# Patient Record
Sex: Male | Born: 1985 | Race: White | Hispanic: No | Marital: Single | Smoking: Never smoker
Health system: Southern US, Community
[De-identification: ages and names within clinical notes are randomized; demographics above are authoritative.]

---

## 2017-01-06 ENCOUNTER — Emergency Department (HOSPITAL_COMMUNITY)
Admission: EM | Admit: 2017-01-06 | Discharge: 2017-01-06 | Disposition: A | Discharge status: Home / Self Care | Payer: Self-pay | Attending: Emergency Medicine | Admitting: Emergency Medicine

## 2017-01-06 ENCOUNTER — Emergency Department (HOSPITAL_COMMUNITY): Payer: Self-pay

## 2017-01-06 ENCOUNTER — Encounter (HOSPITAL_COMMUNITY): Payer: Self-pay

## 2017-01-06 DIAGNOSIS — S01311A Laceration without foreign body of right ear, initial encounter: Secondary | ICD-10-CM | POA: Insufficient documentation

## 2017-01-06 DIAGNOSIS — Y939 Activity, unspecified: Secondary | ICD-10-CM | POA: Insufficient documentation

## 2017-01-06 DIAGNOSIS — Y9259 Other trade areas as the place of occurrence of the external cause: Secondary | ICD-10-CM | POA: Insufficient documentation

## 2017-01-06 DIAGNOSIS — S025XXB Fracture of tooth (traumatic), initial encounter for open fracture: Secondary | ICD-10-CM | POA: Insufficient documentation

## 2017-01-06 DIAGNOSIS — Z23 Encounter for immunization: Secondary | ICD-10-CM | POA: Insufficient documentation

## 2017-01-06 DIAGNOSIS — Y999 Unspecified external cause status: Secondary | ICD-10-CM | POA: Insufficient documentation

## 2017-01-06 DIAGNOSIS — S01511A Laceration without foreign body of lip, initial encounter: Secondary | ICD-10-CM | POA: Insufficient documentation

## 2017-01-06 DIAGNOSIS — S0083XA Contusion of other part of head, initial encounter: Secondary | ICD-10-CM | POA: Insufficient documentation

## 2017-01-06 MED ORDER — LIDOCAINE HCL (PF) 1 % IJ SOLN
5.0000 mL | Freq: Once | INTRAMUSCULAR | Status: AC
  Administered 2017-01-06: 5 mL via INTRADERMAL
  Filled 2017-01-06: qty 5

## 2017-01-06 MED ORDER — TETANUS-DIPHTH-ACELL PERTUSSIS 5-2.5-18.5 LF-MCG/0.5 IM SUSP
0.5000 mL | Freq: Once | INTRAMUSCULAR | Status: AC
  Administered 2017-01-06: 0.5 mL via INTRAMUSCULAR
  Filled 2017-01-06: qty 0.5

## 2017-01-06 NOTE — Discharge Instructions (Signed)
1. Medications: usual home medications 2. Treatment: rest, drink plenty of fluids, ice to the lip;  3. Follow Up: Please followup with a dentist in 2 days for discussion of your diagnoses and further evaluation after today's visit; if you do not have a primary care doctor use the resource guide provided to find one; Please return to the ER for patient changes, worsening pain, persistent bleeding, signs of infection or other concerns
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
## 2017-01-06 NOTE — ED Notes (Signed)
Pt has a lac to the upper lip almost if the tooth perforated the lip.

## 2020-02-14 ENCOUNTER — Inpatient Hospital Stay (HOSPITAL_COMMUNITY): Payer: Medicaid Other

## 2020-02-14 ENCOUNTER — Encounter (HOSPITAL_COMMUNITY): Payer: Self-pay

## 2020-02-14 ENCOUNTER — Emergency Department (HOSPITAL_COMMUNITY): Payer: Medicaid Other

## 2020-02-14 ENCOUNTER — Encounter (HOSPITAL_COMMUNITY)
Admission: EM | Disposition: A | Discharge status: Other Acute Inpatient Hospital | Payer: Self-pay | Source: Home / Self Care

## 2020-02-14 ENCOUNTER — Other Ambulatory Visit: Payer: Self-pay

## 2020-02-14 ENCOUNTER — Inpatient Hospital Stay (HOSPITAL_COMMUNITY)
Admission: EM | Admit: 2020-02-14 | Discharge: 2020-02-16 | DRG: 493 | Disposition: A | Discharge status: Other Acute Inpatient Hospital | Payer: Medicaid Other | Attending: General Surgery | Admitting: General Surgery

## 2020-02-14 ENCOUNTER — Inpatient Hospital Stay (HOSPITAL_COMMUNITY): Payer: Medicaid Other | Admitting: Anesthesiology

## 2020-02-14 DIAGNOSIS — S82209A Unspecified fracture of shaft of unspecified tibia, initial encounter for closed fracture: Secondary | ICD-10-CM | POA: Diagnosis present

## 2020-02-14 DIAGNOSIS — W3301XA Accidental discharge of shotgun, initial encounter: Secondary | ICD-10-CM | POA: Diagnosis not present

## 2020-02-14 DIAGNOSIS — Z419 Encounter for procedure for purposes other than remedying health state, unspecified: Secondary | ICD-10-CM

## 2020-02-14 DIAGNOSIS — S81831A Puncture wound without foreign body, right lower leg, initial encounter: Secondary | ICD-10-CM | POA: Diagnosis present

## 2020-02-14 DIAGNOSIS — S82451B Displaced comminuted fracture of shaft of right fibula, initial encounter for open fracture type I or II: Secondary | ICD-10-CM | POA: Diagnosis present

## 2020-02-14 DIAGNOSIS — S82251B Displaced comminuted fracture of shaft of right tibia, initial encounter for open fracture type I or II: Principal | ICD-10-CM | POA: Diagnosis present

## 2020-02-14 DIAGNOSIS — S8291XC Unspecified fracture of right lower leg, initial encounter for open fracture type IIIA, IIIB, or IIIC: Secondary | ICD-10-CM

## 2020-02-14 DIAGNOSIS — W3400XA Accidental discharge from unspecified firearms or gun, initial encounter: Secondary | ICD-10-CM

## 2020-02-14 DIAGNOSIS — Z9889 Other specified postprocedural states: Secondary | ICD-10-CM

## 2020-02-14 DIAGNOSIS — Z20822 Contact with and (suspected) exposure to covid-19: Secondary | ICD-10-CM | POA: Diagnosis present

## 2020-02-14 DIAGNOSIS — S85171A Laceration of posterior tibial artery, right leg, initial encounter: Secondary | ICD-10-CM | POA: Diagnosis present

## 2020-02-14 DIAGNOSIS — S8981XA Other specified injuries of right lower leg, initial encounter: Secondary | ICD-10-CM | POA: Diagnosis not present

## 2020-02-14 DIAGNOSIS — Z23 Encounter for immunization: Secondary | ICD-10-CM | POA: Diagnosis not present

## 2020-02-14 DIAGNOSIS — T148XXA Other injury of unspecified body region, initial encounter: Secondary | ICD-10-CM

## 2020-02-14 DIAGNOSIS — T1490XA Injury, unspecified, initial encounter: Secondary | ICD-10-CM

## 2020-02-14 HISTORY — PX: WOUND EXPLORATION: SHX6188

## 2020-02-14 HISTORY — PX: EXTERNAL FIXATION LEG: SHX1549

## 2020-02-14 HISTORY — PX: I & D EXTREMITY: SHX5045

## 2020-02-14 LAB — COMPREHENSIVE METABOLIC PANEL
ALT: 23 U/L (ref 0–44)
AST: 20 U/L (ref 15–41)
Albumin: 3.9 g/dL (ref 3.5–5.0)
Alkaline Phosphatase: 51 U/L (ref 38–126)
Anion gap: 11 (ref 5–15)
BUN: 9 mg/dL (ref 6–20)
CO2: 25 mmol/L (ref 22–32)
Calcium: 8.9 mg/dL (ref 8.9–10.3)
Chloride: 103 mmol/L (ref 98–111)
Creatinine, Ser: 1.23 mg/dL (ref 0.61–1.24)
GFR, Estimated: >60 mL/min (ref >60)
Glucose, Bld: 152 mg/dL — ABNORMAL HIGH (ref 70–99)
Potassium: 3.3 mmol/L — ABNORMAL LOW (ref 3.5–5.1)
Sodium: 139 mmol/L (ref 135–145)
Total Bilirubin: 0.8 mg/dL (ref 0.3–1.2)
Total Protein: 7.1 g/dL (ref 6.5–8.1)

## 2020-02-14 LAB — CBC WITH DIFFERENTIAL/PLATELET
Abs Immature Granulocytes: 0.07 10*3/uL (ref 0.00–0.07)
Basophils Absolute: 0.1 10*3/uL (ref 0.0–0.1)
Basophils Relative: 1 %
Eosinophils Absolute: 0.2 10*3/uL (ref 0.0–0.5)
Eosinophils Relative: 1 %
HCT: 46.3 % (ref 39.0–52.0)
Hemoglobin: 15 g/dL (ref 13.0–17.0)
Immature Granulocytes: 0 %
Lymphocytes Relative: 27 %
Lymphs Abs: 4.3 10*3/uL — ABNORMAL HIGH (ref 0.7–4.0)
MCH: 29 pg (ref 26.0–34.0)
MCHC: 32.4 g/dL (ref 30.0–36.0)
MCV: 89.6 fL (ref 80.0–100.0)
Monocytes Absolute: 1 10*3/uL (ref 0.1–1.0)
Monocytes Relative: 7 %
Neutro Abs: 10.3 10*3/uL — ABNORMAL HIGH (ref 1.7–7.7)
Neutrophils Relative %: 64 %
Platelets: 274 10*3/uL (ref 150–400)
RBC: 5.17 MIL/uL (ref 4.22–5.81)
RDW: 12.6 % (ref 11.5–15.5)
WBC: 16 10*3/uL — ABNORMAL HIGH (ref 4.0–10.5)
nRBC: 0 % (ref 0.0–0.2)

## 2020-02-14 LAB — I-STAT CHEM 8, ED
BUN: 12 mg/dL (ref 6–20)
Calcium, Ion: 1.13 mmol/L — ABNORMAL LOW (ref 1.15–1.40)
Chloride: 100 mmol/L (ref 98–111)
Creatinine, Ser: 1.3 mg/dL — ABNORMAL HIGH (ref 0.61–1.24)
Glucose, Bld: 182 mg/dL — ABNORMAL HIGH (ref 70–99)
HCT: 43 % (ref 39.0–52.0)
Hemoglobin: 14.6 g/dL (ref 13.0–17.0)
Potassium: 3.6 mmol/L (ref 3.5–5.1)
Sodium: 140 mmol/L (ref 135–145)
TCO2: 26 mmol/L (ref 22–32)

## 2020-02-14 LAB — RESPIRATORY PANEL BY RT PCR (FLU A&B, COVID)
Influenza A by PCR: NEGATIVE
Influenza B by PCR: NEGATIVE
SARS Coronavirus 2 by RT PCR: NEGATIVE

## 2020-02-14 LAB — ABO/RH: ABO/RH(D): A POS

## 2020-02-14 LAB — TYPE AND SCREEN

## 2020-02-14 SURGERY — IRRIGATION AND DEBRIDEMENT EXTREMITY
Anesthesia: General | Site: Leg Lower | Laterality: Right

## 2020-02-14 MED ORDER — SUGAMMADEX SODIUM 200 MG/2ML IV SOLN
INTRAVENOUS | Status: DC | PRN
  Administered 2020-02-14: 200 mg via INTRAVENOUS

## 2020-02-14 MED ORDER — SUCCINYLCHOLINE CHLORIDE 200 MG/10ML IV SOSY
PREFILLED_SYRINGE | INTRAVENOUS | Status: DC | PRN
  Administered 2020-02-14: 130 mg via INTRAVENOUS

## 2020-02-14 MED ORDER — DEXAMETHASONE SODIUM PHOSPHATE 10 MG/ML IJ SOLN
INTRAMUSCULAR | Status: DC | PRN
  Administered 2020-02-14: 4 mg via INTRAVENOUS

## 2020-02-14 MED ORDER — LACTATED RINGERS IV SOLN: INTRAVENOUS | Status: DC

## 2020-02-14 MED ORDER — LACTATED RINGERS IV SOLN: INTRAVENOUS | Status: DC | PRN

## 2020-02-14 MED ORDER — TETANUS-DIPHTH-ACELL PERTUSSIS 5-2.5-18.5 LF-MCG/0.5 IM SUSY
0.5000 mL | PREFILLED_SYRINGE | Freq: Once | INTRAMUSCULAR | Status: AC
  Administered 2020-02-14: 0.5 mL via INTRAMUSCULAR
  Filled 2020-02-14: qty 0.5

## 2020-02-14 MED ORDER — PROPOFOL 10 MG/ML IV BOLUS
INTRAVENOUS | Status: AC
  Filled 2020-02-14: qty 20

## 2020-02-14 MED ORDER — ACETAMINOPHEN 10 MG/ML IV SOLN
INTRAVENOUS | Status: AC
  Filled 2020-02-14: qty 100

## 2020-02-14 MED ORDER — GENTAMICIN SULFATE 40 MG/ML IJ SOLN
170.0000 mg | Freq: Once | INTRAVENOUS | Status: AC
  Administered 2020-02-14: 170 mg via INTRAVENOUS
  Filled 2020-02-14 (×2): qty 4.25

## 2020-02-14 MED ORDER — ACETAMINOPHEN 500 MG PO TABS
1000.0000 mg | ORAL_TABLET | Freq: Four times a day (QID) | ORAL | Status: DC
  Administered 2020-02-15 – 2020-02-16 (×5): 1000 mg via ORAL
  Filled 2020-02-14 (×5): qty 2

## 2020-02-14 MED ORDER — ONDANSETRON HCL 4 MG/2ML IJ SOLN
INTRAMUSCULAR | Status: DC | PRN
  Administered 2020-02-14: 4 mg via INTRAVENOUS

## 2020-02-14 MED ORDER — HYDROMORPHONE HCL 1 MG/ML IJ SOLN
INTRAMUSCULAR | Status: AC
  Filled 2020-02-14: qty 1

## 2020-02-14 MED ORDER — FENTANYL CITRATE (PF) 100 MCG/2ML IJ SOLN
50.0000 ug | Freq: Once | INTRAMUSCULAR | Status: AC
  Administered 2020-02-14: 50 ug via INTRAVENOUS
  Filled 2020-02-14: qty 2

## 2020-02-14 MED ORDER — FENTANYL CITRATE (PF) 250 MCG/5ML IJ SOLN
INTRAMUSCULAR | Status: AC
  Filled 2020-02-14: qty 5

## 2020-02-14 MED ORDER — OXYCODONE HCL 5 MG/5ML PO SOLN: 5.0000 mg | Freq: Once | ORAL | Status: DC | PRN

## 2020-02-14 MED ORDER — FENTANYL CITRATE (PF) 250 MCG/5ML IJ SOLN
INTRAMUSCULAR | Status: DC | PRN
  Administered 2020-02-14 (×4): 50 ug via INTRAVENOUS

## 2020-02-14 MED ORDER — PHENYLEPHRINE HCL (PRESSORS) 10 MG/ML IV SOLN
INTRAVENOUS | Status: DC | PRN
  Administered 2020-02-14 (×2): 80 ug via INTRAVENOUS

## 2020-02-14 MED ORDER — MIDAZOLAM HCL 2 MG/2ML IJ SOLN
INTRAMUSCULAR | Status: AC
  Filled 2020-02-14: qty 2

## 2020-02-14 MED ORDER — MORPHINE SULFATE (PF) 4 MG/ML IV SOLN
4.0000 mg | INTRAVENOUS | Status: DC | PRN
  Administered 2020-02-15 (×2): 4 mg via INTRAVENOUS
  Filled 2020-02-14 (×2): qty 1

## 2020-02-14 MED ORDER — ONDANSETRON HCL 4 MG/2ML IJ SOLN: 4.0000 mg | Freq: Four times a day (QID) | INTRAMUSCULAR | Status: DC | PRN

## 2020-02-14 MED ORDER — CEFAZOLIN SODIUM-DEXTROSE 2-4 GM/100ML-% IV SOLN
2.0000 g | INTRAVENOUS | Status: AC
  Administered 2020-02-14: 2 g via INTRAVENOUS
  Filled 2020-02-14: qty 100

## 2020-02-14 MED ORDER — DOCUSATE SODIUM 100 MG PO CAPS
100.0000 mg | ORAL_CAPSULE | Freq: Two times a day (BID) | ORAL | Status: DC
  Administered 2020-02-15 – 2020-02-16 (×3): 100 mg via ORAL
  Filled 2020-02-14 (×3): qty 1

## 2020-02-14 MED ORDER — PHENYLEPHRINE HCL-NACL 10-0.9 MG/250ML-% IV SOLN
INTRAVENOUS | Status: DC | PRN
  Administered 2020-02-14: 50 ug/min via INTRAVENOUS

## 2020-02-14 MED ORDER — LIDOCAINE 2% (20 MG/ML) 5 ML SYRINGE
INTRAMUSCULAR | Status: DC | PRN
  Administered 2020-02-14: 60 mg via INTRAVENOUS

## 2020-02-14 MED ORDER — HYDROMORPHONE HCL 1 MG/ML IJ SOLN
0.2500 mg | INTRAMUSCULAR | Status: DC | PRN
  Administered 2020-02-14 (×2): 0.25 mg via INTRAVENOUS
  Administered 2020-02-15 (×3): 0.5 mg via INTRAVENOUS

## 2020-02-14 MED ORDER — PROPOFOL 10 MG/ML IV BOLUS
INTRAVENOUS | Status: DC | PRN
  Administered 2020-02-14: 200 mg via INTRAVENOUS

## 2020-02-14 MED ORDER — OXYCODONE HCL 5 MG PO TABS: 5.0000 mg | ORAL_TABLET | Freq: Once | ORAL | Status: DC | PRN

## 2020-02-14 MED ORDER — OXYCODONE HCL 5 MG PO TABS: 5.0000 mg | ORAL_TABLET | ORAL | Status: DC | PRN

## 2020-02-14 MED ORDER — ACETAMINOPHEN 500 MG PO TABS: 1000.0000 mg | ORAL_TABLET | Freq: Four times a day (QID) | ORAL | Status: DC

## 2020-02-14 MED ORDER — ONDANSETRON 4 MG PO TBDP: 4.0000 mg | ORAL_TABLET | Freq: Four times a day (QID) | ORAL | Status: DC | PRN

## 2020-02-14 MED ORDER — FENTANYL CITRATE (PF) 100 MCG/2ML IJ SOLN
INTRAMUSCULAR | Status: AC
  Filled 2020-02-14: qty 2

## 2020-02-14 MED ORDER — MIDAZOLAM HCL 5 MG/5ML IJ SOLN
INTRAMUSCULAR | Status: DC | PRN
  Administered 2020-02-14 (×2): 1 mg via INTRAVENOUS

## 2020-02-14 MED ORDER — ROCURONIUM BROMIDE 10 MG/ML (PF) SYRINGE
PREFILLED_SYRINGE | INTRAVENOUS | Status: DC | PRN
  Administered 2020-02-14: 30 mg via INTRAVENOUS
  Administered 2020-02-14: 10 mg via INTRAVENOUS

## 2020-02-14 SURGICAL SUPPLY — 80 items
ALCOHOL 70% 16 OZ (MISCELLANEOUS) ×3 IMPLANT
BANDAGE ESMARK 6X9 LF (GAUZE/BANDAGES/DRESSINGS) ×2 IMPLANT
BAR GLASS FIBER EXFX 11X400 (EXFIX) ×6 IMPLANT
BLADE HEX COATED 2.75 (ELECTRODE) ×3 IMPLANT
BNDG COHESIVE 4X5 TAN STRL (GAUZE/BANDAGES/DRESSINGS) IMPLANT
BNDG COHESIVE 6X5 TAN STRL LF (GAUZE/BANDAGES/DRESSINGS) ×3 IMPLANT
BNDG ELASTIC 4X5.8 VLCR STR LF (GAUZE/BANDAGES/DRESSINGS) ×3 IMPLANT
BNDG ELASTIC 6X5.8 VLCR STR LF (GAUZE/BANDAGES/DRESSINGS) ×3 IMPLANT
BNDG ESMARK 6X9 LF (GAUZE/BANDAGES/DRESSINGS) ×3
BNDG GAUZE ELAST 4 BULKY (GAUZE/BANDAGES/DRESSINGS) ×6 IMPLANT
CLAMP BLUE BAR TO PIN (EXFIX) ×6 IMPLANT
CLEANER TIP ELECTROSURG 2X2 (MISCELLANEOUS) ×3 IMPLANT
COVER SURGICAL LIGHT HANDLE (MISCELLANEOUS) ×3 IMPLANT
CUFF TOURN SGL QUICK 18X4 (TOURNIQUET CUFF) IMPLANT
CUFF TOURN SGL QUICK 24 (TOURNIQUET CUFF)
CUFF TOURN SGL QUICK 34 (TOURNIQUET CUFF)
CUFF TOURN SGL QUICK 42 (TOURNIQUET CUFF) IMPLANT
CUFF TRNQT CYL 24X4X16.5-23 (TOURNIQUET CUFF) IMPLANT
CUFF TRNQT CYL 34X4.125X (TOURNIQUET CUFF) IMPLANT
DRAPE C-ARM 42X72 X-RAY (DRAPES) ×3 IMPLANT
DRAPE INCISE IOBAN 66X45 STRL (DRAPES) ×3 IMPLANT
DRAPE OEC MINIVIEW 54X84 (DRAPES) ×3 IMPLANT
DRAPE U-SHAPE 47X51 STRL (DRAPES) ×3 IMPLANT
DRSG ADAPTIC 3X8 NADH LF (GAUZE/BANDAGES/DRESSINGS) ×3 IMPLANT
DRSG PAD ABDOMINAL 8X10 ST (GAUZE/BANDAGES/DRESSINGS) IMPLANT
DURAPREP 26ML APPLICATOR (WOUND CARE) ×3 IMPLANT
ELECT REM PT RETURN 9FT ADLT (ELECTROSURGICAL) ×3
ELECTRODE REM PT RTRN 9FT ADLT (ELECTROSURGICAL) ×2 IMPLANT
GAUZE SPONGE 4X4 12PLY STRL (GAUZE/BANDAGES/DRESSINGS) ×3 IMPLANT
GAUZE XEROFORM 5X9 LF (GAUZE/BANDAGES/DRESSINGS) ×3 IMPLANT
GEL ULTRASOUND 20GR AQUASONIC (MISCELLANEOUS) ×3 IMPLANT
GLOVE BIOGEL PI IND STRL 7.0 (GLOVE) ×2 IMPLANT
GLOVE BIOGEL PI IND STRL 8 (GLOVE) ×2 IMPLANT
GLOVE BIOGEL PI INDICATOR 7.0 (GLOVE) ×1
GLOVE BIOGEL PI INDICATOR 8 (GLOVE) ×1
GLOVE ECLIPSE 7.0 STRL STRAW (GLOVE) ×6 IMPLANT
GLOVE ECLIPSE 8.0 STRL XLNG CF (GLOVE) ×9 IMPLANT
GLOVE SURG SS PI 7.0 STRL IVOR (GLOVE) ×3 IMPLANT
GLOVE SURG UNDER POLY LF SZ7 (GLOVE) ×3 IMPLANT
GOWN STRL REUS W/ TWL LRG LVL3 (GOWN DISPOSABLE) ×6 IMPLANT
GOWN STRL REUS W/ TWL XL LVL3 (GOWN DISPOSABLE) ×2 IMPLANT
GOWN STRL REUS W/TWL LRG LVL3 (GOWN DISPOSABLE) ×3
GOWN STRL REUS W/TWL XL LVL3 (GOWN DISPOSABLE) ×1
HANDPIECE INTERPULSE COAX TIP (DISPOSABLE) ×2
KIT BASIN OR (CUSTOM PROCEDURE TRAY) ×3 IMPLANT
KIT TURNOVER KIT B (KITS) ×3 IMPLANT
MANIFOLD NEPTUNE II (INSTRUMENTS) ×3 IMPLANT
NEEDLE 22X1 1/2 (OR ONLY) (NEEDLE) IMPLANT
NS IRRIG 1000ML POUR BTL (IV SOLUTION) ×3 IMPLANT
PACK ORTHO EXTREMITY (CUSTOM PROCEDURE TRAY) ×3 IMPLANT
PAD ARMBOARD 7.5X6 YLW CONV (MISCELLANEOUS) ×6 IMPLANT
PAD CAST 4YDX4 CTTN HI CHSV (CAST SUPPLIES) IMPLANT
PAD NEG PRESSURE SENSATRAC (MISCELLANEOUS) ×3 IMPLANT
PADDING CAST COTTON 4X4 STRL (CAST SUPPLIES)
PADDING CAST COTTON 6X4 STRL (CAST SUPPLIES) ×9 IMPLANT
PIN CLAMP 2BAR 75MM BLUE (EXFIX) ×3 IMPLANT
PIN HALF YELLOW 5X160X35 (EXFIX) ×6 IMPLANT
PIN TRANSFIXING 5.0 (EXFIX) ×3 IMPLANT
SET HNDPC FAN SPRY TIP SCT (DISPOSABLE) ×4 IMPLANT
SPONGE LAP 18X18 RF (DISPOSABLE) ×15 IMPLANT
STAPLER VISISTAT 35W (STAPLE) IMPLANT
STOCKINETTE IMPERVIOUS 9X36 MD (GAUZE/BANDAGES/DRESSINGS) ×3 IMPLANT
STOCKINETTE IMPERVIOUS LG (DRAPES) ×3 IMPLANT
STRIP CLOSURE SKIN 1/2X4 (GAUZE/BANDAGES/DRESSINGS) IMPLANT
SUCTION FRAZIER HANDLE 10FR (MISCELLANEOUS) ×1
SUCTION TUBE FRAZIER 10FR DISP (MISCELLANEOUS) ×2 IMPLANT
SUT ETHILON 2 0 FS 18 (SUTURE) ×6 IMPLANT
SUT ETHILON 3 0 PS 1 (SUTURE) IMPLANT
SUT VIC AB 0 CT1 27 (SUTURE) ×2
SUT VIC AB 0 CT1 27XBRD ANBCTR (SUTURE) ×4 IMPLANT
SUT VIC AB 2-0 CT1 27 (SUTURE) ×2
SUT VIC AB 2-0 CT1 TAPERPNT 27 (SUTURE) ×4 IMPLANT
SUT VIC AB 2-0 SH 27 (SUTURE) ×2
SUT VIC AB 2-0 SH 27X BRD (SUTURE) ×4 IMPLANT
SYR CONTROL 10ML LL (SYRINGE) IMPLANT
TOWEL GREEN STERILE (TOWEL DISPOSABLE) ×3 IMPLANT
TOWEL GREEN STERILE FF (TOWEL DISPOSABLE) ×6 IMPLANT
TUBE CONNECTING 12X1/4 (SUCTIONS) ×3 IMPLANT
UNDERPAD 30X36 HEAVY ABSORB (UNDERPADS AND DIAPERS) ×3 IMPLANT
YANKAUER SUCT BULB TIP NO VENT (SUCTIONS) ×3 IMPLANT
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
## 2020-02-14 NOTE — Transfer of Care (Signed)
Immediate Anesthesia Transfer of Care Note  Patient: Jeffery Melton  Procedure(s) Performed: IRRIGATION AND DEBRIDEMENT EXTREMITY (Right ) EXTERNAL FIXATION LEG (Right ) WOUND EXPLORATION (Right Leg Lower)  Patient Location: PACU  Anesthesia Type:General  Level of Consciousness: awake  Airway & Oxygen Therapy: Patient Spontanous Breathing and Patient connected to nasal cannula oxygen  Post-op Assessment: Report given to RN and Post -op Vital signs reviewed and stable  Post vital signs: Reviewed and stable  Last Vitals:  Vitals Value Taken Time  BP 132/95 02/14/20 2323  Temp    Pulse 106 02/14/20 2328  Resp 13 02/14/20 2328  SpO2 97 % 02/14/20 2328  Vitals shown include unvalidated device data.  Last Pain:  Vitals:   02/14/20 1749  TempSrc:   PainSc: 7       Patients Stated Pain Goal: 3 (02/14/20 1749)  Complications: No complications documented.

## 2020-02-14 NOTE — Anesthesia Procedure Notes (Signed)
Procedure Name: Intubation Date/Time: 02/14/2020 9:32 PM Performed by: Edmonia Caprio, CRNA Pre-anesthesia Checklist: Patient identified, Emergency Drugs available, Suction available, Patient being monitored and Timeout performed Patient Re-evaluated:Patient Re-evaluated prior to induction Oxygen Delivery Method: Circle system utilized Preoxygenation: Pre-oxygenation with 100% oxygen Induction Type: IV induction and Rapid sequence Laryngoscope Size: Glidescope and 4 Grade View: Grade I Tube type: Oral Tube size: 7.5 mm Number of attempts: 1 Airway Equipment and Method: Stylet and Video-laryngoscopy Placement Confirmation: positive ETCO2,  ETT inserted through vocal cords under direct vision and breath sounds checked- equal and bilateral Secured at: 23 cm Tube secured with: Tape Dental Injury: Teeth and Oropharynx as per pre-operative assessment
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
## 2020-02-14 NOTE — Progress Notes (Signed)
Orthopedic Tech Progress Note Patient Details:  Jeffery Melton 09-12-1985 [id number]  Ortho Devices Type of Ortho Device: Post (short leg) splint Ortho Device/Splint Location: rle. I applied a posterior short leg splint in the position the leg was laying in at drs request. Ortho Device/Splint Interventions: Ordered, Application, Adjustment   Post Interventions Patient Tolerated: Well Instructions Provided: Care of device, Adjustment of device   Trinna Post 02/14/2020, 8:39 PM
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
## 2020-02-14 NOTE — ED Notes (Signed)
Dr. Bedelia Person (trauma) at bedside.
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
## 2020-02-15 ENCOUNTER — Inpatient Hospital Stay (HOSPITAL_COMMUNITY): Payer: Medicaid Other

## 2020-02-15 LAB — BASIC METABOLIC PANEL
Anion gap: 8 (ref 5–15)
BUN: 10 mg/dL (ref 6–20)
CO2: 25 mmol/L (ref 22–32)
Calcium: 8.4 mg/dL — ABNORMAL LOW (ref 8.9–10.3)
Chloride: 104 mmol/L (ref 98–111)
Creatinine, Ser: 1.12 mg/dL (ref 0.61–1.24)
GFR, Estimated: >60 mL/min (ref >60)
Glucose, Bld: 137 mg/dL — ABNORMAL HIGH (ref 70–99)
Potassium: 4.7 mmol/L (ref 3.5–5.1)
Sodium: 137 mmol/L (ref 135–145)

## 2020-02-15 LAB — BPAM RBC
Blood Product Expiration Date: 202112092359
Blood Product Expiration Date: 202112092359
ISSUE DATE / TIME: 202111201918
ISSUE DATE / TIME: 202111201918
Unit Type and Rh: 5100
Unit Type and Rh: 5100

## 2020-02-15 LAB — TYPE AND SCREEN
ABO/RH(D): A POS
Antibody Screen: NEGATIVE
Unit division: 0
Unit division: 0

## 2020-02-15 LAB — CBC
HCT: 42 % (ref 39.0–52.0)
Hemoglobin: 13.7 g/dL (ref 13.0–17.0)
MCH: 29.1 pg (ref 26.0–34.0)
MCHC: 32.6 g/dL (ref 30.0–36.0)
MCV: 89.4 fL (ref 80.0–100.0)
Platelets: 227 10*3/uL (ref 150–400)
RBC: 4.7 MIL/uL (ref 4.22–5.81)
RDW: 13.2 % (ref 11.5–15.5)
WBC: 14.7 10*3/uL — ABNORMAL HIGH (ref 4.0–10.5)
nRBC: 0 % (ref 0.0–0.2)

## 2020-02-15 LAB — HIV ANTIBODY (ROUTINE TESTING W REFLEX): HIV Screen 4th Generation wRfx: NONREACTIVE

## 2020-02-15 LAB — ETHANOL: Alcohol, Ethyl (B): <10 mg/dL (ref <10)

## 2020-02-15 MED ORDER — ONDANSETRON HCL 4 MG PO TABS: 4.0000 mg | ORAL_TABLET | Freq: Four times a day (QID) | ORAL | Status: DC | PRN

## 2020-02-15 MED ORDER — METHOCARBAMOL 500 MG PO TABS
500.0000 mg | ORAL_TABLET | Freq: Four times a day (QID) | ORAL | Status: DC | PRN
  Administered 2020-02-15 – 2020-02-16 (×3): 500 mg via ORAL
  Filled 2020-02-15 (×3): qty 1

## 2020-02-15 MED ORDER — OXYCODONE HCL 5 MG PO TABS
5.0000 mg | ORAL_TABLET | ORAL | Status: DC | PRN
  Administered 2020-02-15 – 2020-02-16 (×5): 10 mg via ORAL
  Filled 2020-02-15 (×5): qty 2

## 2020-02-15 MED ORDER — METHOCARBAMOL 1000 MG/10ML IJ SOLN
500.0000 mg | Freq: Four times a day (QID) | INTRAVENOUS | Status: DC | PRN
  Filled 2020-02-15: qty 5

## 2020-02-15 MED ORDER — DOCUSATE SODIUM 100 MG PO CAPS: 100.0000 mg | ORAL_CAPSULE | Freq: Two times a day (BID) | ORAL | Status: DC

## 2020-02-15 MED ORDER — CEFAZOLIN SODIUM-DEXTROSE 2-4 GM/100ML-% IV SOLN
2.0000 g | Freq: Three times a day (TID) | INTRAVENOUS | Status: DC
  Administered 2020-02-15: 2 g via INTRAVENOUS
  Filled 2020-02-15: qty 100

## 2020-02-15 MED ORDER — DEXTROSE 5 % IV SOLN: 3.0000 g | INTRAVENOUS | Status: DC

## 2020-02-15 MED ORDER — SODIUM CHLORIDE 0.9 % IV SOLN: INTRAVENOUS | Status: DC

## 2020-02-15 MED ORDER — ENOXAPARIN SODIUM 30 MG/0.3ML ~~LOC~~ SOLN
30.0000 mg | Freq: Two times a day (BID) | SUBCUTANEOUS | Status: DC
  Administered 2020-02-15 – 2020-02-16 (×3): 30 mg via SUBCUTANEOUS
  Filled 2020-02-15 (×3): qty 0.3

## 2020-02-15 MED ORDER — CHLORHEXIDINE GLUCONATE 4 % EX LIQD: 60.0000 mL | Freq: Once | CUTANEOUS | Status: DC

## 2020-02-15 MED ORDER — LACTATED RINGERS IV SOLN: INTRAVENOUS | Status: AC

## 2020-02-15 MED ORDER — ONDANSETRON HCL 4 MG/2ML IJ SOLN: 4.0000 mg | Freq: Four times a day (QID) | INTRAMUSCULAR | Status: DC | PRN

## 2020-02-15 MED ORDER — HYDROMORPHONE HCL 1 MG/ML IJ SOLN
0.5000 mg | INTRAMUSCULAR | Status: DC | PRN
  Administered 2020-02-16 (×3): 0.5 mg via INTRAVENOUS
  Filled 2020-02-15 (×3): qty 1

## 2020-02-15 MED ORDER — SODIUM CHLORIDE 0.9 % IV SOLN: 2.0000 g | INTRAVENOUS | Status: DC

## 2020-02-15 MED ORDER — METOCLOPRAMIDE HCL 10 MG PO TABS: 5.0000 mg | ORAL_TABLET | Freq: Three times a day (TID) | ORAL | Status: DC | PRN

## 2020-02-15 MED ORDER — HYDROMORPHONE HCL 1 MG/ML IJ SOLN
INTRAMUSCULAR | Status: AC
  Filled 2020-02-15: qty 1

## 2020-02-15 MED ORDER — METOCLOPRAMIDE HCL 5 MG/ML IJ SOLN: 5.0000 mg | Freq: Three times a day (TID) | INTRAMUSCULAR | Status: DC | PRN

## 2020-02-15 MED ORDER — POVIDONE-IODINE 10 % EX SWAB: 2.0000 "application " | Freq: Once | CUTANEOUS | Status: DC
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
## 2020-02-15 NOTE — Progress Notes (Signed)
Right foot perfused this morning VAC is functional Patient does have some sensation on the plantar aspect of the foot as well as dorsal aspect Tibial nerve was structurally intact visually at the time of surgery Significant bone loss and soft tissue defect is present We will consult orthopedic trauma service for further management beginning next week

## 2020-02-15 NOTE — Progress Notes (Signed)
   02/15/20 0858  OTHER  Substance Abuse Screening unable to be completed due to:  Patient unable to participate  Substance Abuse Education Offered Yes  Substance abuse interventions Other (must comment) (Patient presented flat affect. Denied SI since son was born. Son is 33 y/o. Reports social drinking, denied any concerns with it or substance use.)  (CAGE-AID) Substance Abuse Screening Tool  Have You Ever Felt You Ought to Cut Down on Your Drinking or Drug Use? 0  Have People Annoyed You By Critizing Your Drinking Or Drug Use? 0  Have You Felt Bad Or Guilty About Your Drinking Or Drug Use? 0  Have You Ever Had a Drink or Used Drugs First Thing In The Morning to Steady Your Nerves or to Get Rid of a Hangover? 0  CAGE-AID Score 0

## 2020-02-15 NOTE — Plan of Care (Signed)
  Problem: Education: Goal: Knowledge of General Education information will improve Description Including pain rating scale, medication(s)/side effects and non-pharmacologic comfort measures Outcome: Progressing   

## 2020-02-15 NOTE — Plan of Care (Signed)
  Problem: Education: Goal: Knowledge of General Education information will improve Description: Including pain rating scale, medication(s)/side effects and non-pharmacologic comfort measures 02/15/2020 1918 by Sharol Given, RN Outcome: Progressing 02/15/2020 1903 by Sharol Given, RN Outcome: Progressing
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
## 2020-02-15 NOTE — Progress Notes (Signed)
Pacu Nursing Note  Patient's belongings brought to room 4NP13. Given to [employer]. When going through them, it was noted that pt had a cell phone, keys, one shoe, some colorful beaded jewelry, a pocket knife and $364 dollars in cash. 2 other 4NP RNs were in room.   Security was called to come get the cash and knife to lock up during stay.
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
## 2020-02-16 ENCOUNTER — Inpatient Hospital Stay (HOSPITAL_COMMUNITY): Payer: Medicaid Other | Admitting: Certified Registered Nurse Anesthetist

## 2020-02-16 ENCOUNTER — Inpatient Hospital Stay (HOSPITAL_COMMUNITY): Payer: Medicaid Other

## 2020-02-16 ENCOUNTER — Encounter (HOSPITAL_COMMUNITY): Payer: Self-pay

## 2020-02-16 ENCOUNTER — Encounter (HOSPITAL_COMMUNITY)
Admission: EM | Disposition: A | Discharge status: Other Acute Inpatient Hospital | Payer: Self-pay | Source: Home / Self Care

## 2020-02-16 HISTORY — PX: I & D EXTREMITY: SHX5045

## 2020-02-16 LAB — CBC
HCT: 32 % — ABNORMAL LOW (ref 39.0–52.0)
Hemoglobin: 10.4 g/dL — ABNORMAL LOW (ref 13.0–17.0)
MCH: 29.1 pg (ref 26.0–34.0)
MCHC: 32.5 g/dL (ref 30.0–36.0)
MCV: 89.6 fL (ref 80.0–100.0)
Platelets: 202 10*3/uL (ref 150–400)
RBC: 3.57 MIL/uL — ABNORMAL LOW (ref 4.22–5.81)
RDW: 12.9 % (ref 11.5–15.5)
WBC: 14.1 10*3/uL — ABNORMAL HIGH (ref 4.0–10.5)
nRBC: 0 % (ref 0.0–0.2)

## 2020-02-16 LAB — BASIC METABOLIC PANEL
Anion gap: 9 (ref 5–15)
BUN: 7 mg/dL (ref 6–20)
CO2: 28 mmol/L (ref 22–32)
Calcium: 8.1 mg/dL — ABNORMAL LOW (ref 8.9–10.3)
Chloride: 99 mmol/L (ref 98–111)
Creatinine, Ser: 1.12 mg/dL (ref 0.61–1.24)
GFR, Estimated: >60 mL/min (ref >60)
Glucose, Bld: 161 mg/dL — ABNORMAL HIGH (ref 70–99)
Potassium: 4.1 mmol/L (ref 3.5–5.1)
Sodium: 136 mmol/L (ref 135–145)

## 2020-02-16 LAB — SURGICAL PCR SCREEN
MRSA, PCR: NEGATIVE
Staphylococcus aureus: POSITIVE — AB

## 2020-02-16 SURGERY — IRRIGATION AND DEBRIDEMENT EXTREMITY
Anesthesia: General | Laterality: Right

## 2020-02-16 MED ORDER — PHENYLEPHRINE 40 MCG/ML (10ML) SYRINGE FOR IV PUSH (FOR BLOOD PRESSURE SUPPORT)
PREFILLED_SYRINGE | INTRAVENOUS | Status: DC | PRN
  Administered 2020-02-16: 120 ug via INTRAVENOUS

## 2020-02-16 MED ORDER — FENTANYL CITRATE (PF) 100 MCG/2ML IJ SOLN
INTRAMUSCULAR | Status: DC | PRN
  Administered 2020-02-16 (×4): 50 ug via INTRAVENOUS

## 2020-02-16 MED ORDER — ACETAMINOPHEN 325 MG PO TABS: 325.0000 mg | ORAL_TABLET | ORAL | Status: DC | PRN

## 2020-02-16 MED ORDER — CEFAZOLIN SODIUM-DEXTROSE 2-3 GM-%(50ML) IV SOLR
INTRAVENOUS | Status: DC | PRN
  Administered 2020-02-16: 2 g via INTRAVENOUS

## 2020-02-16 MED ORDER — PHENYLEPHRINE 40 MCG/ML (10ML) SYRINGE FOR IV PUSH (FOR BLOOD PRESSURE SUPPORT)
PREFILLED_SYRINGE | INTRAVENOUS | Status: AC
  Filled 2020-02-16: qty 10

## 2020-02-16 MED ORDER — VANCOMYCIN HCL 1000 MG IV SOLR
INTRAVENOUS | Status: DC | PRN
  Administered 2020-02-16: 1000 mg

## 2020-02-16 MED ORDER — MEPERIDINE HCL 25 MG/ML IJ SOLN: 6.2500 mg | INTRAMUSCULAR | Status: DC | PRN

## 2020-02-16 MED ORDER — ONDANSETRON HCL 4 MG/2ML IJ SOLN
INTRAMUSCULAR | Status: AC
  Filled 2020-02-16: qty 2

## 2020-02-16 MED ORDER — PROPOFOL 10 MG/ML IV BOLUS
INTRAVENOUS | Status: DC | PRN
  Administered 2020-02-16: 200 mg via INTRAVENOUS

## 2020-02-16 MED ORDER — VANCOMYCIN HCL 1000 MG IV SOLR
INTRAVENOUS | Status: AC
  Filled 2020-02-16: qty 1000

## 2020-02-16 MED ORDER — CHLORHEXIDINE GLUCONATE 0.12 % MT SOLN
OROMUCOSAL | Status: AC
  Administered 2020-02-16: 15 mL via OROMUCOSAL
  Filled 2020-02-16: qty 15

## 2020-02-16 MED ORDER — MUPIROCIN 2 % EX OINT
1.0000 "application " | TOPICAL_OINTMENT | Freq: Two times a day (BID) | CUTANEOUS | Status: DC
  Administered 2020-02-16: 1 via NASAL
  Filled 2020-02-16: qty 22

## 2020-02-16 MED ORDER — SODIUM CHLORIDE 0.9 % IV SOLN
2.0000 g | INTRAVENOUS | Status: DC
  Administered 2020-02-16: 2 g via INTRAVENOUS
  Filled 2020-02-16: qty 20

## 2020-02-16 MED ORDER — CHLORHEXIDINE GLUCONATE CLOTH 2 % EX PADS: 6.0000 | MEDICATED_PAD | Freq: Every day | CUTANEOUS | Status: DC

## 2020-02-16 MED ORDER — OXYCODONE HCL 5 MG PO TABS: 5.0000 mg | ORAL_TABLET | Freq: Once | ORAL | Status: DC | PRN

## 2020-02-16 MED ORDER — DEXAMETHASONE SODIUM PHOSPHATE 10 MG/ML IJ SOLN
INTRAMUSCULAR | Status: AC
  Filled 2020-02-16: qty 1

## 2020-02-16 MED ORDER — FENTANYL CITRATE (PF) 100 MCG/2ML IJ SOLN
25.0000 ug | INTRAMUSCULAR | Status: DC | PRN
  Administered 2020-02-16 (×2): 50 ug via INTRAVENOUS

## 2020-02-16 MED ORDER — DEXAMETHASONE SODIUM PHOSPHATE 10 MG/ML IJ SOLN
INTRAMUSCULAR | Status: DC | PRN
  Administered 2020-02-16: 10 mg via INTRAVENOUS

## 2020-02-16 MED ORDER — CEFAZOLIN SODIUM 1 G IJ SOLR
INTRAMUSCULAR | Status: AC
  Filled 2020-02-16: qty 20

## 2020-02-16 MED ORDER — TOBRAMYCIN SULFATE 1.2 G IJ SOLR
INTRAMUSCULAR | Status: AC
  Filled 2020-02-16: qty 1.2

## 2020-02-16 MED ORDER — TOBRAMYCIN SULFATE 1.2 G IJ SOLR
INTRAMUSCULAR | Status: DC | PRN
  Administered 2020-02-16: 1.2 g

## 2020-02-16 MED ORDER — FENTANYL CITRATE (PF) 250 MCG/5ML IJ SOLN
INTRAMUSCULAR | Status: AC
  Filled 2020-02-16: qty 5

## 2020-02-16 MED ORDER — FENTANYL CITRATE (PF) 100 MCG/2ML IJ SOLN
INTRAMUSCULAR | Status: AC
  Administered 2020-02-16: 50 ug via INTRAVENOUS
  Filled 2020-02-16: qty 2

## 2020-02-16 MED ORDER — ONDANSETRON HCL 4 MG/2ML IJ SOLN
INTRAMUSCULAR | Status: DC | PRN
  Administered 2020-02-16: 4 mg via INTRAVENOUS

## 2020-02-16 MED ORDER — 0.9 % SODIUM CHLORIDE (POUR BTL) OPTIME
TOPICAL | Status: DC | PRN
  Administered 2020-02-16: 1000 mL

## 2020-02-16 MED ORDER — PROPOFOL 10 MG/ML IV BOLUS
INTRAVENOUS | Status: AC
  Filled 2020-02-16: qty 20

## 2020-02-16 MED ORDER — OXYCODONE HCL 5 MG/5ML PO SOLN: 5.0000 mg | Freq: Once | ORAL | Status: DC | PRN

## 2020-02-16 MED ORDER — SODIUM CHLORIDE 0.9 % IR SOLN
Status: DC | PRN
  Administered 2020-02-16: 3000 mL

## 2020-02-16 MED ORDER — MIDAZOLAM HCL 2 MG/2ML IJ SOLN
INTRAMUSCULAR | Status: DC | PRN
  Administered 2020-02-16: 2 mg via INTRAVENOUS

## 2020-02-16 MED ORDER — CHLORHEXIDINE GLUCONATE 0.12 % MT SOLN: 15.0000 mL | Freq: Once | OROMUCOSAL | Status: AC

## 2020-02-16 MED ORDER — FENTANYL CITRATE (PF) 100 MCG/2ML IJ SOLN: 50.0000 ug | Freq: Once | INTRAMUSCULAR | Status: AC

## 2020-02-16 MED ORDER — ONDANSETRON HCL 4 MG/2ML IJ SOLN: 4.0000 mg | Freq: Once | INTRAMUSCULAR | Status: DC | PRN

## 2020-02-16 MED ORDER — LACTATED RINGERS IV SOLN: INTRAVENOUS | Status: DC | PRN

## 2020-02-16 MED ORDER — ACETAMINOPHEN 160 MG/5ML PO SOLN: 325.0000 mg | ORAL | Status: DC | PRN

## 2020-02-16 MED ORDER — FENTANYL CITRATE (PF) 100 MCG/2ML IJ SOLN
INTRAMUSCULAR | Status: AC
  Filled 2020-02-16: qty 2

## 2020-02-16 MED ORDER — LIDOCAINE HCL (CARDIAC) PF 100 MG/5ML IV SOSY
PREFILLED_SYRINGE | INTRAVENOUS | Status: DC | PRN
  Administered 2020-02-16: 60 mg via INTRAVENOUS

## 2020-02-16 MED ORDER — MIDAZOLAM HCL 2 MG/2ML IJ SOLN
INTRAMUSCULAR | Status: AC
  Filled 2020-02-16: qty 2

## 2020-02-16 SURGICAL SUPPLY — 50 items
BNDG COHESIVE 4X5 TAN STRL (GAUZE/BANDAGES/DRESSINGS) ×2 IMPLANT
BNDG ELASTIC 6X10 VLCR STRL LF (GAUZE/BANDAGES/DRESSINGS) ×1 IMPLANT
BNDG GAUZE ELAST 4 BULKY (GAUZE/BANDAGES/DRESSINGS) ×4 IMPLANT
BRUSH SCRUB EZ PLAIN DRY (MISCELLANEOUS) ×4 IMPLANT
CHLORAPREP W/TINT 26 (MISCELLANEOUS) ×2 IMPLANT
COVER MAYO STAND STRL (DRAPES) ×2 IMPLANT
COVER SURGICAL LIGHT HANDLE (MISCELLANEOUS) ×4 IMPLANT
COVER WAND RF STERILE (DRAPES) ×2 IMPLANT
DRAPE ORTHO SPLIT 77X108 STRL (DRAPES) ×1
DRAPE SURG 17X23 STRL (DRAPES) ×2 IMPLANT
DRAPE SURG ORHT 6 SPLT 77X108 (DRAPES) ×1 IMPLANT
DRAPE U-SHAPE 47X51 STRL (DRAPES) ×2 IMPLANT
DRSG ADAPTIC 3X8 NADH LF (GAUZE/BANDAGES/DRESSINGS) ×2 IMPLANT
DRSG VERAFLO VAC MED (GAUZE/BANDAGES/DRESSINGS) ×1 IMPLANT
ELECT REM PT RETURN 9FT ADLT (ELECTROSURGICAL)
ELECTRODE REM PT RTRN 9FT ADLT (ELECTROSURGICAL) IMPLANT
EVACUATOR 1/8 PVC DRAIN (DRAIN) IMPLANT
GAUZE SPONGE 4X4 12PLY STRL (GAUZE/BANDAGES/DRESSINGS) ×2 IMPLANT
GLOVE BIO SURGEON STRL SZ 6.5 (GLOVE) ×6 IMPLANT
GLOVE BIO SURGEON STRL SZ7.5 (GLOVE) ×8 IMPLANT
GLOVE BIOGEL PI IND STRL 6.5 (GLOVE) ×1 IMPLANT
GLOVE BIOGEL PI IND STRL 7.5 (GLOVE) ×1 IMPLANT
GLOVE BIOGEL PI INDICATOR 6.5 (GLOVE) ×1
GLOVE BIOGEL PI INDICATOR 7.5 (GLOVE) ×1
GOWN STRL REUS W/ TWL LRG LVL3 (GOWN DISPOSABLE) ×2 IMPLANT
GOWN STRL REUS W/TWL LRG LVL3 (GOWN DISPOSABLE) ×2
HANDPIECE INTERPULSE COAX TIP (DISPOSABLE)
KIT BASIN OR (CUSTOM PROCEDURE TRAY) ×2 IMPLANT
KIT TURNOVER KIT B (KITS) ×2 IMPLANT
MANIFOLD NEPTUNE II (INSTRUMENTS) ×2 IMPLANT
NS IRRIG 1000ML POUR BTL (IV SOLUTION) ×2 IMPLANT
PACK ORTHO EXTREMITY (CUSTOM PROCEDURE TRAY) ×2 IMPLANT
PAD ARMBOARD 7.5X6 YLW CONV (MISCELLANEOUS) ×4 IMPLANT
PADDING CAST ABS 6INX4YD NS (CAST SUPPLIES) ×1
PADDING CAST ABS COTTON 6X4 NS (CAST SUPPLIES) IMPLANT
PADDING CAST COTTON 6X4 STRL (CAST SUPPLIES) ×2 IMPLANT
SET HNDPC FAN SPRY TIP SCT (DISPOSABLE) IMPLANT
SPLINT FIBERGLASS 4X30 (CAST SUPPLIES) ×1 IMPLANT
SPONGE LAP 18X18 RF (DISPOSABLE) ×2 IMPLANT
SUT ETHILON 2 0 FS 18 (SUTURE) ×4 IMPLANT
SUT ETHILON 3 0 PS 1 (SUTURE) ×4 IMPLANT
SUT MON AB 2-0 CT1 36 (SUTURE) ×2 IMPLANT
SUT PDS AB 0 CT 36 (SUTURE) IMPLANT
SWAB CULTURE ESWAB REG 1ML (MISCELLANEOUS) IMPLANT
TOWEL GREEN STERILE (TOWEL DISPOSABLE) ×4 IMPLANT
TOWEL GREEN STERILE FF (TOWEL DISPOSABLE) ×2 IMPLANT
TUBE CONNECTING 12X1/4 (SUCTIONS) ×2 IMPLANT
UNDERPAD 30X36 HEAVY ABSORB (UNDERPADS AND DIAPERS) ×2 IMPLANT
WATER STERILE IRR 1000ML POUR (IV SOLUTION) ×2 IMPLANT
YANKAUER SUCT BULB TIP NO VENT (SUCTIONS) ×2 IMPLANT
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
## 2020-02-16 NOTE — Transfer of Care (Signed)
Immediate Anesthesia Transfer of Care Note  Patient: Jeffery Melton  Procedure(s) Performed: IRRIGATION AND DEBRIDEMENT RIGHT OPEN TIBIA WITH WOUND VAC CHANGE (Right )  Patient Location: PACU  Anesthesia Type:General  Level of Consciousness: awake and alert   Airway & Oxygen Therapy: Patient Spontanous Breathing and Patient connected to nasal cannula oxygen  Post-op Assessment: Report given to RN and Post -op Vital signs reviewed and stable  Post vital signs: Reviewed and stable  Last Vitals:  Vitals Value Taken Time  BP 153/97 02/16/20 1220  Temp 37.2 C 02/16/20 1219  Pulse 101 02/16/20 1223  Resp 15 02/16/20 1223  SpO2 90 % 02/16/20 1223  Vitals shown include unvalidated device data.  Last Pain:  Vitals:   02/16/20 0747  TempSrc: Oral  PainSc:       Patients Stated Pain Goal: 2 (02/16/20 0547)  Complications: No complications documented.

## 2020-02-16 NOTE — Progress Notes (Signed)
OT Cancellation Note  Patient Details Name: Jeffery Melton MRN: [id number] DOB: 1985/06/24   Cancelled Treatment:    Reason Eval/Treat Not Completed: Patient at procedure or test/ unavailable (to OR for repeat I&D). Will follow up for OT eval as able.  Marcy Siren, OT Acute Rehabilitation Services Pager [phone] Office [phone]   Orlando Penner 02/16/2020, 9:52 AM
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
## 2020-02-16 NOTE — Progress Notes (Addendum)
21:31 Called report to Janne Napoleon, Charity fundraiser at [facility] [employer] on [address] [phone].  Patient will be going to room 6305 and accepting physician is Delia Chimes.    21:39 Spoke with Sam at [facility] [phone] and gave information for patient transport.  Sam will call back and let us know what the expected time frame will be.

## 2020-02-16 NOTE — Interval H&P Note (Signed)
History and Physical Interval Note:  02/16/2020 10:44 AM  Jeffery Melton  has presented today for surgery, with the diagnosis of Right open tibia fracture.  The various methods of treatment have been discussed with the patient and family. After consideration of risks, benefits and other options for treatment, the patient has consented to  Procedure(s): IRRIGATION AND DEBRIDEMENT RIGHT OPEN TIBIA WITH WOUND VAC CHANGE (Right) as a surgical intervention.  The patient's history has been reviewed, patient examined, no change in status, stable for surgery.  I have reviewed the patient's chart and labs.  Questions were answered to the patient's satisfaction.     Caryn Bee P Tomicka Lover

## 2020-02-16 NOTE — Progress Notes (Addendum)
RN called and spoke to [facility] [phone]) to clarify where we were in the transfer process.  Representative informed me that they have received the request and that we are waiting on bed availability at this time.  [facility] will call and notify us when a bed is available and we will be responsible for arranging transport for the patient at that time.
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
## 2020-02-16 NOTE — Progress Notes (Signed)
Attempted to contact significant other but no answer and voicemail if full leaving me unable to leave a message. Will try to call again later.
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
## 2020-02-16 NOTE — Anesthesia Procedure Notes (Signed)
Procedure Name: LMA Insertion Date/Time: 02/16/2020 11:03 AM Performed by: Epifanio Lesches, CRNA Pre-anesthesia Checklist: Patient identified, Emergency Drugs available, Suction available and Patient being monitored Patient Re-evaluated:Patient Re-evaluated prior to induction Oxygen Delivery Method: Circle System Utilized Preoxygenation: Pre-oxygenation with 100% oxygen Induction Type: IV induction Ventilation: Mask ventilation without difficulty LMA: LMA inserted LMA Size: 4.0 Number of attempts: 1 Airway Equipment and Method: Bite block Placement Confirmation: positive ETCO2 Tube secured with: Tape Dental Injury: Teeth and Oropharynx as per pre-operative assessment

## 2020-02-16 NOTE — Progress Notes (Signed)
PPT Cancellation Note  Patient Details Name: Jeffery Melton MRN: [id number] DOB: [birth year]   Cancelled Treatment:    Reason Eval/Treat Not Completed: Patient at procedure or test/unavailable;Pain limiting ability to participate. Pt initially off unit for I&D upon arrival of PT, upon checking back in afternoon, the pt was in significant pain and was not ready for therapy at this time. Will continue to follow and treat as time/schedule allow tomorrow (11/23).   Deland Pretty, DPT   Acute Rehabilitation Department Pager #: [phone]   Gaetana Michaelis 02/16/2020, 5:28 PM
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
## 2020-02-17 ENCOUNTER — Encounter (HOSPITAL_COMMUNITY): Payer: Self-pay | Admitting: Student

## 2020-02-17 NOTE — Anesthesia Postprocedure Evaluation (Signed)
Anesthesia Post Note  Patient: Jeffery Melton  Procedure(s) Performed: IRRIGATION AND DEBRIDEMENT EXTREMITY (Right ) EXTERNAL FIXATION LEG (Right ) WOUND EXPLORATION (Right Leg Lower)     Patient location during evaluation: PACU Anesthesia Type: General Level of consciousness: awake and alert Pain management: pain level controlled Vital Signs Assessment: post-procedure vital signs reviewed and stable Respiratory status: spontaneous breathing, nonlabored ventilation, respiratory function stable and patient connected to nasal cannula oxygen Cardiovascular status: blood pressure returned to baseline and stable Postop Assessment: no apparent nausea or vomiting Anesthetic complications: no   No complications documented.  Last Vitals:  Vitals:   02/16/20 2200 02/16/20 2330  BP: 120/79 120/70  Pulse: (!) 106 (!) 105  Resp: 10 11  Temp: 37.5 C 37.4 C  SpO2: 97% 95%    Last Pain:  Vitals:   02/16/20 2330  TempSrc: Oral  PainSc: 6                  Ranessa Kosta S
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
## 2021-11-04 IMAGING — DX DG ANKLE PORT 2V*R*
3 series · 3 of 3 positions shown · non-contrast
Comparison: Intraoperative images right ankle February 16, 2020
obtained earlier in the day; CT right ankle February 15, 2020

CLINICAL DATA: External fixation for comminuted fractures of the
tibia and fibula

EXAM:
PORTABLE RIGHT ANKLE - 2 VIEW

[ankle ap]
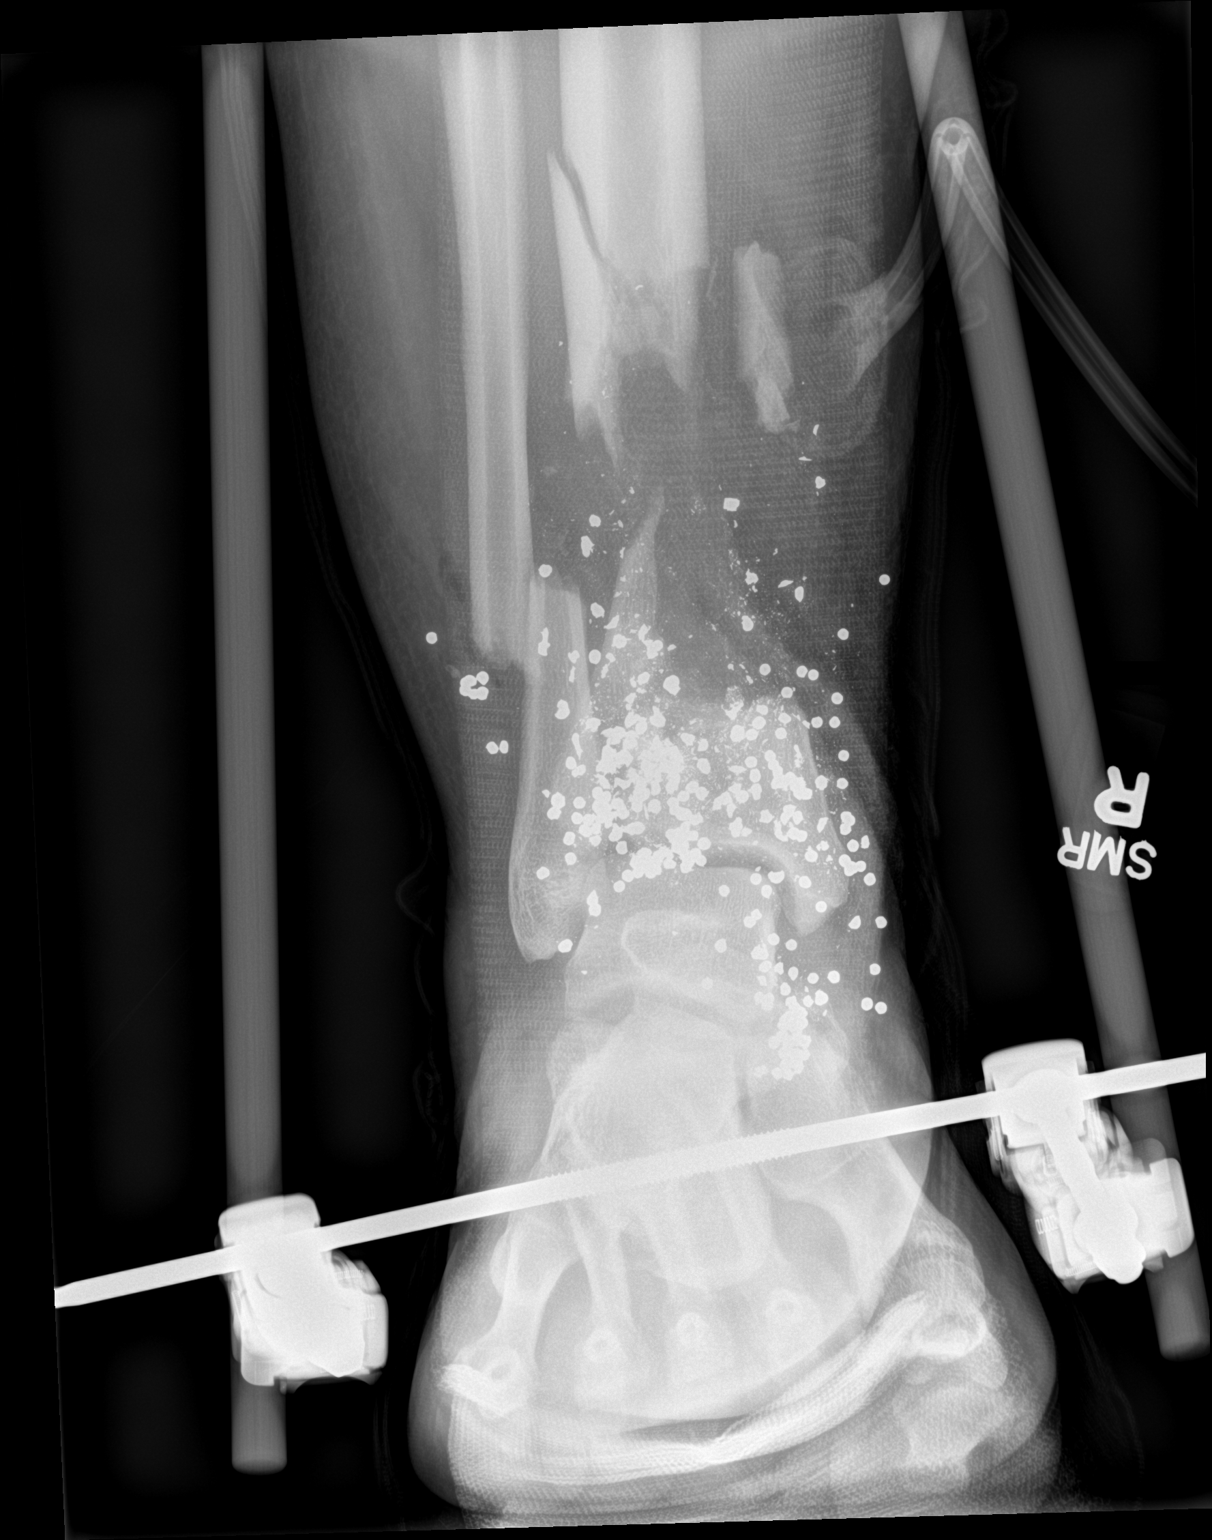

[ankle obl]
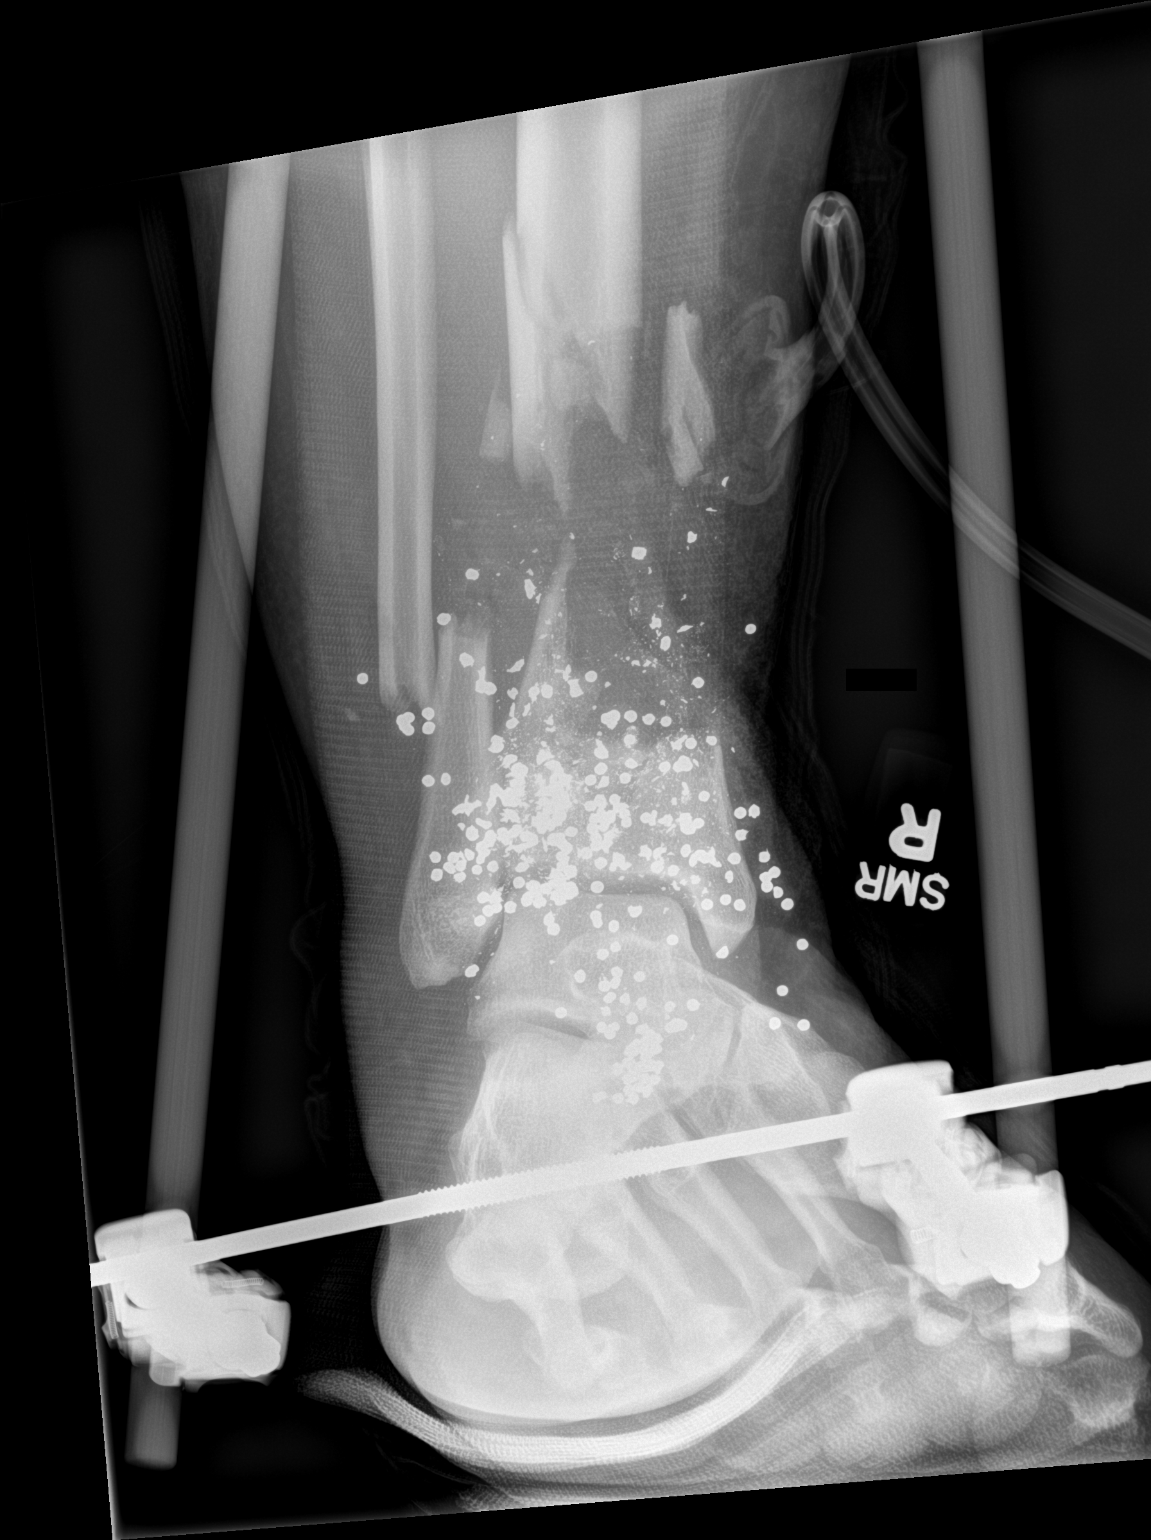

[ankle lat]
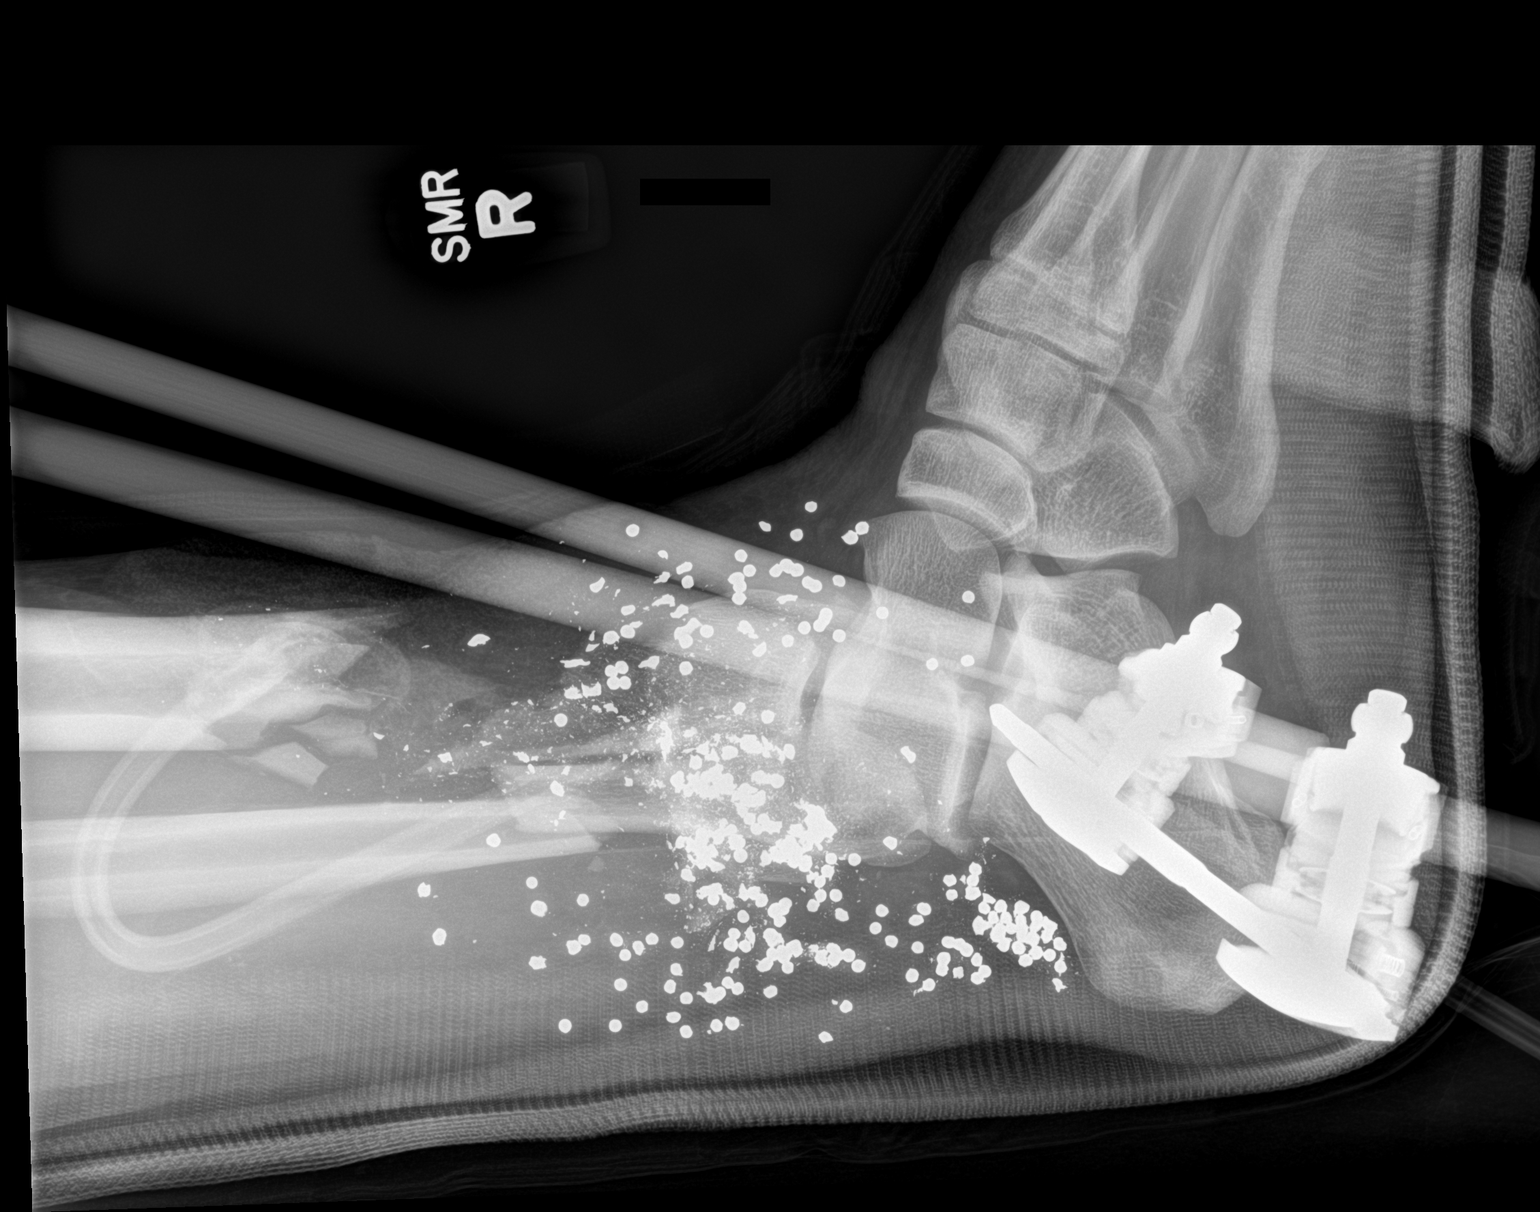

[3 of 3 positions shown; findings below may reference images not displayed]

FINDINGS: Frontal, oblique, and lateral views obtained. External fixation
device in place. Comminuted fracture of the distal tibia with
innumerable metallic fragments in this area again noted. Bony defect
extends over up to 7 cm in length. There is a fracture of the distal
fibular diaphysis with medial displacement distally and 1.5 cm of
overriding of fracture fragments. No gross dislocation evident. No
joint space narrowing.
IMPRESSION: Extensive postoperative defects appear unchanged with external
fixation device in place. Fragments of bone missing from the distal
tibia with up to 7 cm of displacement of fracture fragments.
Fracture of the distal fibular diaphysis with 1.5 cm of overriding
of fracture fragments. Medial displacement distally in this area.
Innumerable metallic foreign bodies in this region. No dislocation
or joint space narrowing evident.
